# Patient Record
Sex: Male | Born: 1960 | Race: White | Hispanic: No | Marital: Married | Smoking: Never smoker
Health system: Southern US, Community
[De-identification: ages and names within clinical notes are randomized; demographics above are authoritative.]

---

## 2017-04-03 ENCOUNTER — Encounter (HOSPITAL_COMMUNITY): Payer: Self-pay | Admitting: Emergency Medicine

## 2017-04-03 ENCOUNTER — Emergency Department (HOSPITAL_COMMUNITY): Payer: Managed Care, Other (non HMO)

## 2017-04-03 ENCOUNTER — Emergency Department (HOSPITAL_COMMUNITY)
Admission: EM | Admit: 2017-04-03 | Discharge: 2017-04-04 | Disposition: A | Discharge status: Other Acute Inpatient Hospital | Payer: Managed Care, Other (non HMO) | Attending: Emergency Medicine | Admitting: Emergency Medicine

## 2017-04-03 DIAGNOSIS — S01111A Laceration without foreign body of right eyelid and periocular area, initial encounter: Secondary | ICD-10-CM | POA: Insufficient documentation

## 2017-04-03 DIAGNOSIS — S51851A Open bite of right forearm, initial encounter: Secondary | ICD-10-CM | POA: Insufficient documentation

## 2017-04-03 DIAGNOSIS — Y999 Unspecified external cause status: Secondary | ICD-10-CM | POA: Diagnosis not present

## 2017-04-03 DIAGNOSIS — S61552A Open bite of left wrist, initial encounter: Secondary | ICD-10-CM | POA: Diagnosis not present

## 2017-04-03 DIAGNOSIS — S0185XA Open bite of other part of head, initial encounter: Secondary | ICD-10-CM | POA: Diagnosis not present

## 2017-04-03 DIAGNOSIS — Y9389 Activity, other specified: Secondary | ICD-10-CM | POA: Insufficient documentation

## 2017-04-03 DIAGNOSIS — R51 Headache: Secondary | ICD-10-CM | POA: Diagnosis not present

## 2017-04-03 DIAGNOSIS — Y929 Unspecified place or not applicable: Secondary | ICD-10-CM | POA: Diagnosis not present

## 2017-04-03 DIAGNOSIS — Z23 Encounter for immunization: Secondary | ICD-10-CM | POA: Diagnosis not present

## 2017-04-03 DIAGNOSIS — W540XXA Bitten by dog, initial encounter: Secondary | ICD-10-CM | POA: Insufficient documentation

## 2017-04-03 DIAGNOSIS — H209 Unspecified iridocyclitis: Secondary | ICD-10-CM

## 2017-04-03 MED ORDER — TETANUS-DIPHTH-ACELL PERTUSSIS 5-2.5-18.5 LF-MCG/0.5 IM SUSP
0.5000 mL | Freq: Once | INTRAMUSCULAR | Status: AC
  Administered 2017-04-03: 0.5 mL via INTRAMUSCULAR

## 2017-04-03 MED ORDER — TETRACAINE HCL 0.5 % OP SOLN
1.0000 [drp] | Freq: Once | OPHTHALMIC | Status: AC
  Administered 2017-04-03: 1 [drp] via OPHTHALMIC
  Filled 2017-04-03: qty 4

## 2017-04-03 MED ORDER — BUPIVACAINE HCL (PF) 0.5 % IJ SOLN
30.0000 mL | Freq: Once | INTRAMUSCULAR | Status: AC
  Administered 2017-04-03: 30 mL
  Filled 2017-04-03: qty 30

## 2017-04-03 MED ORDER — ONDANSETRON HCL 4 MG/2ML IJ SOLN
INTRAMUSCULAR | Status: AC
  Filled 2017-04-03: qty 2

## 2017-04-03 MED ORDER — HYDROMORPHONE HCL 1 MG/ML IJ SOLN
INTRAMUSCULAR | Status: AC
  Filled 2017-04-03: qty 1

## 2017-04-03 MED ORDER — HYDROMORPHONE HCL 1 MG/ML IJ SOLN
1.0000 mg | Freq: Once | INTRAMUSCULAR | Status: AC
  Administered 2017-04-03: 1 mg via INTRAVENOUS

## 2017-04-03 MED ORDER — LIDOCAINE HCL (PF) 1 % IJ SOLN
10.0000 mL | Freq: Once | INTRAMUSCULAR | Status: AC
  Administered 2017-04-04: 10 mL via INTRADERMAL
  Filled 2017-04-03: qty 10

## 2017-04-03 MED ORDER — FLUORESCEIN SODIUM 0.6 MG OP STRP
1.0000 | ORAL_STRIP | Freq: Once | OPHTHALMIC | Status: AC
  Administered 2017-04-03: 1 via OPHTHALMIC
  Filled 2017-04-03: qty 1

## 2017-04-03 MED ORDER — ONDANSETRON HCL 4 MG/2ML IJ SOLN
4.0000 mg | Freq: Once | INTRAMUSCULAR | Status: AC
  Administered 2017-04-03: 4 mg via INTRAVENOUS

## 2017-04-03 MED ORDER — SODIUM CHLORIDE 0.9 % IV SOLN
3.0000 g | Freq: Once | INTRAVENOUS | Status: AC
  Administered 2017-04-03: 3 g via INTRAVENOUS
  Filled 2017-04-03: qty 3

## 2017-04-03 NOTE — ED Triage Notes (Signed)
Pt attacked by his pitbull. Lac to right head- skull showing. Right eyebrow lac. Bilateral Eyes intact. Deep right forearm lac with muscle, fascia showing, deep right lateral thigh lac. Left arm puncture wounds and and wrist laceration. Bleeding controlled. EMS gave Fentanyl. Pt alert and oriented. BP 118/70.

## 2017-04-03 NOTE — ED Notes (Signed)
Assumed care on pt. , pt. currently at CT scan.  

## 2017-04-03 NOTE — Consult Note (Signed)
Ophthalmology Consult  This is a 56 yo male who has an ocular history of cataract surgery as well as Lasik surgery who was attacked by his pitbull today.  Pt with scalp lacerations as well as bites to the leg.  Pt also had bite to the right side of face and eye.  Pt feels vision is good but is seeing new floaters in the right eye.  On exam pt was 20/40 in the right eye and 20/30 in the left eye.  After seeing no obvious open globe IOP was checked and found to be 16 in the right and 14 in the left.  The right pupil was slightly larger than the left but no obvious RAPD was seen.  Extraocular motility was intact and visual field was full to confrontation.  On exam pt was found to have a transverse laceration of the right upper eyelid which appears to involve the levator being that there is very little levator function.  Pt has two verticle full thickness lacerations that are through the lid margin.  Pt with inferior lid laceration at lid margin as well.  Pt has subconjunctival heme inferiorly and possible puncture wound superotemporally however seidel negative.  The cornea is clear in both eyes.  Pt does have 2-3+ cell/flare in the right eye.  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  On dfe of the right eye pt was found to have commotio and intraretinal heme around optic nerve and throughout retina.  A/P 1.Trauma to the right eye with multiple lid lacerations.  Can not rule out open globe however vision stable and iop normal.  Due to extensive lid lacerations and involvement of levator muscle [facility].  This is a dirty wound being from dig bite and pt at high risk for infection.    Thank you for allowing me to participate in the care of this patient.  Please feel free to contact me if you have any concerns.  Mia Creekimothy Camilia Caywood, M.D. Cell [phone] Office [phone]

## 2017-04-03 NOTE — ED Provider Notes (Signed)
[facility]-EMERGENCY DEPT Provider Note   CSN: [id number] Arrival date & time: 04/03/17  1825     History   Chief Complaint Chief Complaint  Patient presents with  . Animal Bite    HPI Eric Salinas is a 56 y.o. male.  The history is provided by the patient and medical records.  Animal Bite  Contact animal:  Dog Time since incident:  1 hour Pain details:    Quality:  Aching   Severity:  Severe   Timing:  Constant   Progression:  Unchanged Incident location:  Home Provoked: provoked   Animal's rabies vaccination status:  Up to date Animal in possession: yes   Tetanus status:  Unknown Relieved by:  Nothing Worsened by:  Nothing Ineffective treatments:  None tried Associated symptoms: no fever and no rash     History reviewed. No pertinent past medical history.  There are no active problems to display for this patient.   History reviewed. No pertinent surgical history.     Home Medications    Prior to Admission medications   Not on File    Family History History reviewed. No pertinent family history.  Social History Social History  Substance Use Topics  . Smoking status: Never Smoker  . Smokeless tobacco: Never Used  . Alcohol use Yes     Allergies   Patient has no known allergies.   Review of Systems Review of Systems  Constitutional: Negative for chills and fever.  HENT: Negative for ear pain and sore throat.   Eyes: Negative for pain and visual disturbance.  Respiratory: Negative for cough and shortness of breath.   Cardiovascular: Negative for chest pain and palpitations.  Gastrointestinal: Negative for abdominal pain and vomiting.  Genitourinary: Negative for dysuria and hematuria.  Musculoskeletal: Negative for arthralgias and back pain.  Skin: Positive for wound. Negative for color change and rash.  Neurological: Negative for seizures and syncope.  All other systems reviewed and are negative.    Physical Exam Updated Vital  Signs BP 132/78 (BP Location: Left Arm)   Pulse 92   Resp 16   Ht 6' (1.829 m)   Wt 83.9 kg (185 lb)   SpO2 99%   BMI 25.09 kg/m   Physical Exam  Constitutional: He appears well-developed.  HENT:  Head: Normocephalic and atraumatic.  Complex R upper eyelid laceration involving deep tissues. Inferior portion of lacerated upper eyelid does not move with rest of eyelid. R Scalp laceration with exposed subdermal tissue. R eye with NO extravasation after fluorescein stain. R eye appears red and inflamed  Eyes: Conjunctivae are normal.  Neck: Neck supple.  Cardiovascular: Normal rate and regular rhythm.   No murmur heard. Pulmonary/Chest: Effort normal and breath sounds normal. No respiratory distress.  Abdominal: Soft. There is no tenderness.  Musculoskeletal: He exhibits tenderness.  L wrist linear laceration repaired. R forearm with complex laceration with damaged muscle. R knee laceration with exposed tissue. Per [facility], wounds wrapped with wet-to-dry dressing for followup.   Neurological: He is alert. No cranial nerve deficit. Coordination normal.  Moves all extremities  Skin: Skin is warm and dry.  Nursing note and vitals reviewed.    ED Treatments / Results  Labs (all labs ordered are listed, but only abnormal results are displayed) Labs Reviewed - No data to display  EKG  EKG Interpretation None       Radiology Dg Forearm Right  Result Date: 04/03/2017 CLINICAL DATA:  Pt c/o generalized left hand pain, proximal and  posterior right arm pain severe laceration/dog bite, and lateral right knee severe laceration/dog bite after his dog attacked him today. EXAM: RIGHT FOREARM - 2 VIEW COMPARISON:  None. FINDINGS: Significant soft tissue lacerations are identified along the mid aspect of the forearm. No forearm fracture. At the posterior base the wrist, a small bone density is identified, raising the question of a triquetrum fracture. Correlation with point tenderness is  recommended. Consider wrist views if needed. IMPRESSION: Significant soft tissue lacerations of the forearm. Question of injury of the wrist. Consider views of the wrist as needed. Electronically Signed   By: Norva Pavlov M.D.   On: 04/03/2017 20:02   Ct Head Wo Contrast  Result Date: 04/03/2017 CLINICAL DATA:  56 year old male attack by dog with a laceration to the right side of the head. EXAM: CT HEAD WITHOUT CONTRAST CT MAXILLOFACIAL WITHOUT CONTRAST TECHNIQUE: Multidetector CT imaging of the head and maxillofacial structures were performed using the standard protocol without intravenous contrast. Multiplanar CT image reconstructions of the maxillofacial structures were also generated. COMPARISON:  None. FINDINGS: CT HEAD FINDINGS Brain: No evidence of acute infarction, hemorrhage, hydrocephalus, extra-axial collection or mass lesion/mass effect. Vascular: No hyperdense vessel or unexpected calcification. Skull: Normal. Negative for fracture or focal lesion. Other: There is laceration of the skin over the right temple with extension of soft tissue air anteriorly and to the right periorbital region. No large hematoma or drainable fluid collection. CT MAXILLOFACIAL FINDINGS Osseous: No fracture or mandibular dislocation. No destructive process. Orbits: Negative. No traumatic or inflammatory finding. Sinuses: Clear. Soft tissues: There is laceration of the skin over the right temporal area. Moderate amount of soft tissue air in the right temporal region and masticator space as well as right periorbital area. No fluid collection or hematoma. IMPRESSION: 1. No acute intracranial pathology. 2. No acute facial bone fractures. 3. Laceration of the skin over the right temple with extension of soft tissue air to the right periorbital region. No large hematoma or fluid collection. Electronically Signed   By: Elgie Collard M.D.   On: 04/03/2017 19:25   Dg Knee Complete 4 Views Right  Result Date:  04/03/2017 CLINICAL DATA:  Pt c/o generalized left hand pain, proximal and posterior right arm pain severe laceration/dog bite, and lateral right knee severe laceration/dog bite after his dog attacked him today. EXAM: RIGHT KNEE - COMPLETE 4+ VIEW COMPARISON:  None. FINDINGS: Soft tissue gas is identified along the anterior aspect of the distal thigh. No acute fracture or subluxation identified. No joint effusion. IMPRESSION: Soft tissue gas in the distal thigh.  No acute fracture of the knee. Electronically Signed   By: Norva Pavlov M.D.   On: 04/03/2017 20:07   Dg Hand Complete Left  Result Date: 04/03/2017 CLINICAL DATA:  Pt c/o generalized left hand pain, proximal and posterior right arm pain severe laceration/dog bite, and lateral right knee severe laceration/dog bite after his dog attacked him today. EXAM: LEFT HAND - COMPLETE 3+ VIEW COMPARISON:  None. FINDINGS: Soft tissue gas is noted along the distal aspect of the forearm. There is no acute fracture or subluxation. No radiopaque foreign body. IMPRESSION: Soft tissue gas along the distal forearm. Electronically Signed   By: Norva Pavlov M.D.   On: 04/03/2017 20:00   Ct Maxillofacial Wo Contrast  Result Date: 04/03/2017 CLINICAL DATA:  56 year old male attack by dog with a laceration to the right side of the head. EXAM: CT HEAD WITHOUT CONTRAST CT MAXILLOFACIAL WITHOUT CONTRAST TECHNIQUE: Multidetector CT  imaging of the head and maxillofacial structures were performed using the standard protocol without intravenous contrast. Multiplanar CT image reconstructions of the maxillofacial structures were also generated. COMPARISON:  None. FINDINGS: CT HEAD FINDINGS Brain: No evidence of acute infarction, hemorrhage, hydrocephalus, extra-axial collection or mass lesion/mass effect. Vascular: No hyperdense vessel or unexpected calcification. Skull: Normal. Negative for fracture or focal lesion. Other: There is laceration of the skin over the right  temple with extension of soft tissue air anteriorly and to the right periorbital region. No large hematoma or drainable fluid collection. CT MAXILLOFACIAL FINDINGS Osseous: No fracture or mandibular dislocation. No destructive process. Orbits: Negative. No traumatic or inflammatory finding. Sinuses: Clear. Soft tissues: There is laceration of the skin over the right temporal area. Moderate amount of soft tissue air in the right temporal region and masticator space as well as right periorbital area. No fluid collection or hematoma. IMPRESSION: 1. No acute intracranial pathology. 2. No acute facial bone fractures. 3. Laceration of the skin over the right temple with extension of soft tissue air to the right periorbital region. No large hematoma or fluid collection. Electronically Signed   By: Elgie Collard M.D.   On: 04/03/2017 19:25    Procedures .Marland KitchenLaceration Repair Date/Time: 04/03/2017 6:59 PM Performed by: Hebert Soho Authorized by: Hebert Soho   Consent:    Consent obtained:  Verbal   Consent given by:  Patient   Risks discussed:  Pain, infection, poor cosmetic result and poor wound healing   Alternatives discussed:  No treatment Anesthesia (see MAR for exact dosages):    Anesthesia method:  Local infiltration   Local anesthetic:  Bupivacaine 0.5% w/o epi Laceration details:    Location:  Shoulder/arm   Shoulder/arm location:  L lower arm   Length (cm):  4 Repair type:    Repair type:  Simple Pre-procedure details:    Preparation:  Patient was prepped and draped in usual sterile fashion Treatment:    Amount of cleaning:  Extensive   Irrigation solution:  Sterile water Skin repair:    Repair method:  Sutures   Number of sutures:  5 Approximation:    Approximation:  Close   Vermilion border: well-aligned   Post-procedure details:    Patient tolerance of procedure:  Tolerated well, no immediate complications   .Marland KitchenLaceration Repair Date/Time: 04/04/2017 12:32 AM Performed by: Hebert Soho Authorized by: Hebert Soho   Consent:    Consent obtained:  Verbal   Consent given by:  Patient   Risks discussed:  Infection, pain, poor cosmetic result, poor wound healing and need for additional repair Anesthesia (see MAR for exact dosages):    Anesthesia method:  Local infiltration   Local anesthetic:  Lidocaine 1% w/o epi Laceration details:    Location:  Scalp   Scalp location:  R temporal Repair type:    Repair type:  Simple Pre-procedure details:    Preparation:  Patient was prepped and draped in usual sterile fashion Treatment:    Area cleansed with:  Soap and water   Amount of cleaning:  Standard   Visualized foreign bodies/material removed: no   Mucous membrane repair:    Suture size:  4-0   Suture material:  Vicryl Skin repair:    Repair method:  Staples   Number of staples:  11 Approximation:    Approximation:  Loose   Vermilion border: well-aligned   Post-procedure details:    Patient tolerance of procedure:  Tolerated well, no immediate complications   (including critical  care time)  Medications Ordered in ED Medications  Tdap (BOOSTRIX) injection 0.5 mL (0.5 mLs Intramuscular Given 04/03/17 1839)  Ampicillin-Sulbactam (UNASYN) 3 g in sodium chloride 0.9 % 100 mL IVPB (0 g Intravenous Stopped 04/03/17 2010)  HYDROmorphone (DILAUDID) injection 1 mg (1 mg Intravenous Given 04/03/17 1839)  ondansetron (ZOFRAN) injection 4 mg (4 mg Intravenous Given 04/03/17 1839)  tetracaine (PONTOCAINE) 0.5 % ophthalmic solution 1 drop (1 drop Both Eyes Given by Other 04/03/17 1849)  fluorescein ophthalmic strip 1 strip (1 strip Both Eyes Given by Other 04/03/17 1849)  bupivacaine (MARCAINE) 0.5 % injection 30 mL (30 mLs Infiltration Given 04/03/17 1953)     Initial Impression / Assessment and Plan / ED Course  I have reviewed the triage vital signs and the nursing notes.  Pertinent labs & imaging results that were available during my care of the patient were reviewed by me  and considered in my medical decision making (see chart for details).     56 year old previously male who presents via EMS for multiple complex dog bites. Occurred around 1700 today. Bitten by rescued pitbull as patient was attemptin to exchange object in dog's mouth. Pt was bitten across R scalp, R eye, R forearm, L wrist/hand, and R distal thigh. Pt does not take anticoagulation. No LOC. Unsure of last tetanus.  AF, VSS. Complex R upper eyelid laceration involving deep tissues. Inferior portion of lacerated upper eyelid does not move with rest of eyelid. R Scalp laceration with exposed subdermal tissue. R eye with NO extravasation after fluorescein stain. R eye appears red and inflamed. L wrist linear laceration repaired. R forearm with complex laceration with damaged muscle. R knee laceration with exposed tissue.   Per [facility], R forearm and R knee wounds wrapped with wet-to-dry dressing for followup.   Tetanus and unasyn given. L wrist and hand lacerations repaired. R scalp laceration repaired.  Ophthalmology consulted for complex R upper eyelid laceration involving underlying muscle. Traumatic iritis noted with cell and flare. IOP normal per Ophthalmology. Due to complexity of eyelid wound, [facility] (accepted by Ophthalmology).   Pt care d/w Dr. Silverio LayYao  Final Clinical Impressions(s) / ED Diagnoses   Final diagnoses:  Dog bite of face, initial encounter  Dog bite of right forearm, initial encounter  Dog bite of left wrist, initial encounter  Traumatic iritis  Right eyelid laceration, initial encounter    New Prescriptions New Prescriptions   No medications on file     Hebert SohoMu, Kareem Cathey, MD 04/04/17 0035    Charlynne PanderYao, David Hsienta, MD 04/04/17 [phone]

## 2017-04-04 DIAGNOSIS — Y929 Unspecified place or not applicable: Secondary | ICD-10-CM | POA: Diagnosis not present

## 2017-04-04 DIAGNOSIS — W540XXA Bitten by dog, initial encounter: Secondary | ICD-10-CM | POA: Diagnosis not present

## 2017-04-04 DIAGNOSIS — S01111A Laceration without foreign body of right eyelid and periocular area, initial encounter: Secondary | ICD-10-CM | POA: Diagnosis present

## 2017-04-04 DIAGNOSIS — S51851A Open bite of right forearm, initial encounter: Secondary | ICD-10-CM | POA: Diagnosis not present

## 2017-04-04 DIAGNOSIS — R51 Headache: Secondary | ICD-10-CM | POA: Diagnosis not present

## 2017-04-04 DIAGNOSIS — Y9389 Activity, other specified: Secondary | ICD-10-CM | POA: Diagnosis not present

## 2017-04-04 DIAGNOSIS — S61552A Open bite of left wrist, initial encounter: Secondary | ICD-10-CM | POA: Diagnosis not present

## 2017-04-04 DIAGNOSIS — Y999 Unspecified external cause status: Secondary | ICD-10-CM | POA: Diagnosis not present

## 2017-04-04 DIAGNOSIS — S0185XA Open bite of other part of head, initial encounter: Secondary | ICD-10-CM | POA: Diagnosis not present

## 2017-04-04 DIAGNOSIS — Z23 Encounter for immunization: Secondary | ICD-10-CM | POA: Diagnosis not present

## 2017-04-04 MED ORDER — HYDROMORPHONE HCL 1 MG/ML IJ SOLN
1.0000 mg | Freq: Once | INTRAMUSCULAR | Status: AC
  Administered 2017-04-04: 1 mg via INTRAVENOUS
  Filled 2017-04-04: qty 1

## 2018-12-26 IMAGING — CT CT HEAD W/O CM
3 of 6 series · 15 of 47 positions shown, 18 images · non-contrast
Comparison: None.

CLINICAL DATA: 56-year-old male attack by dog with a laceration to
the right side of the head.

EXAM:
CT HEAD WITHOUT CONTRAST
CT MAXILLOFACIAL WITHOUT CONTRAST
TECHNIQUE: Multidetector CT imaging of the head and maxillofacial structures
were performed using the standard protocol without intravenous
contrast. Multiplanar CT image reconstructions of the maxillofacial
structures were also generated.

[Series 5: head 3.0 mpr cor · coronal · 0.32mm/px · 3 of 73 slices shown]
[im 18/73  brain]
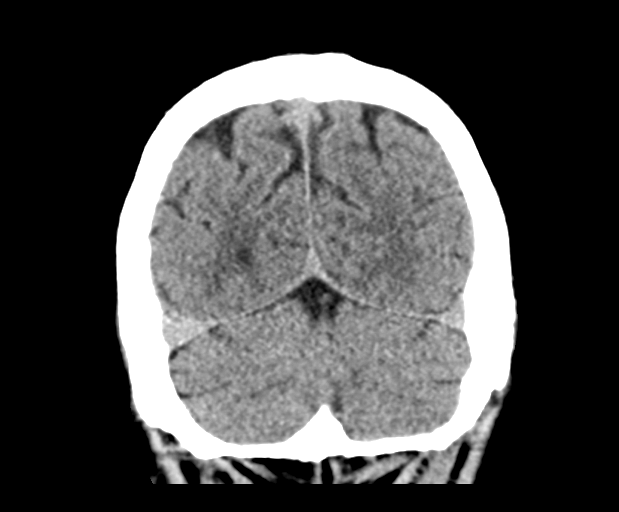
[im 36/73  brain]
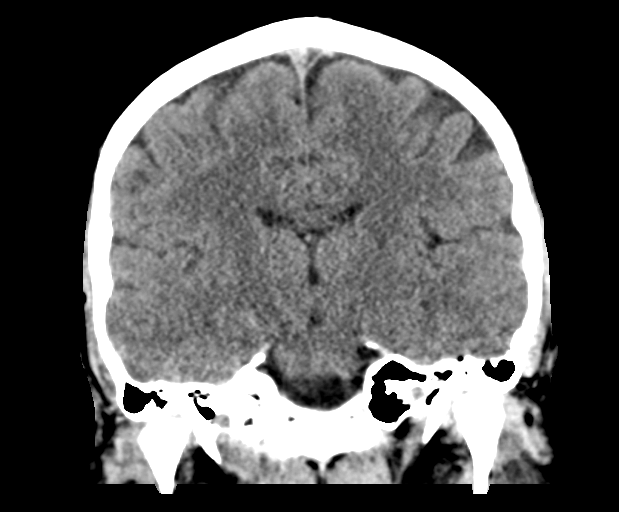
[im 54/73  brain]
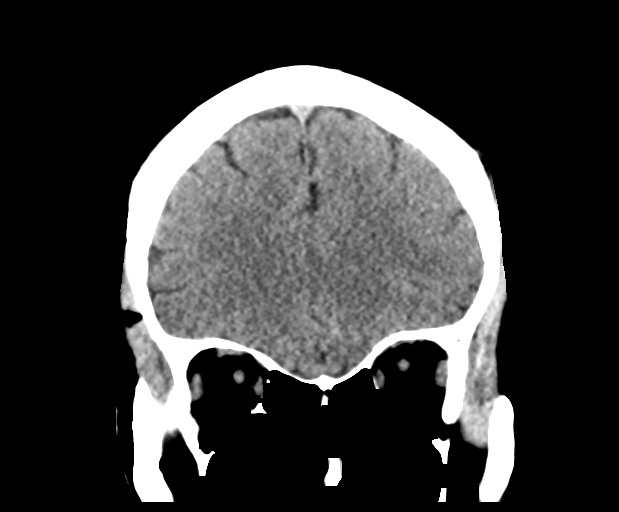

[Series 7: facial/ orbits 2.0 h30s · axial · 0.34mm/px · z∈[-248,-90]mm · 10 of 93 slices shown, 13 images]
[im 9/93  brain]
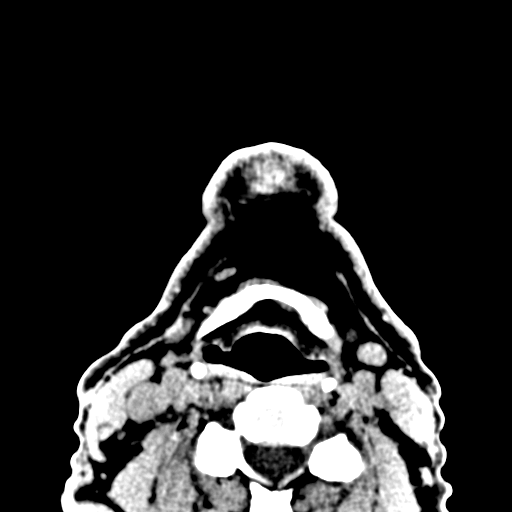
[im 9/93  bone]
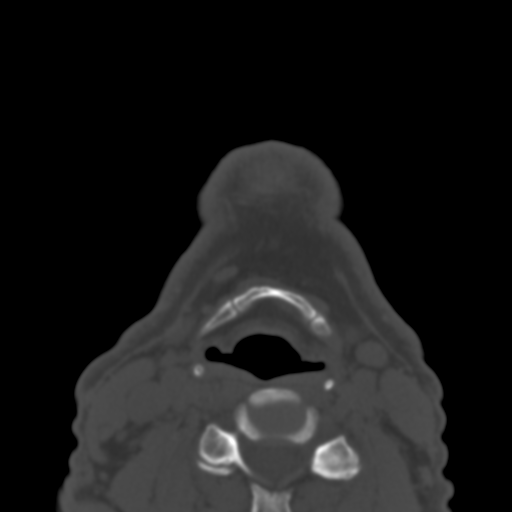
[im 18/93  brain]
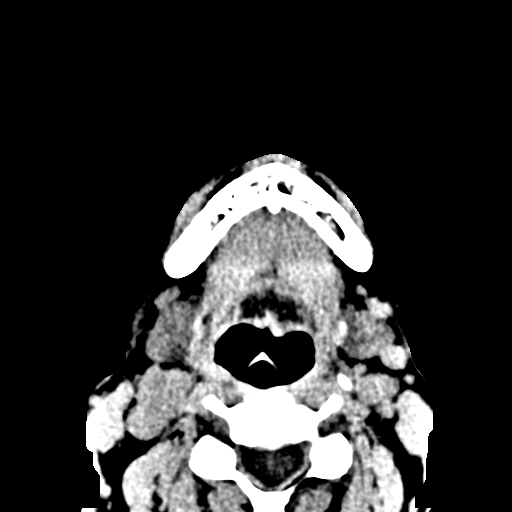
[im 27/93  brain]
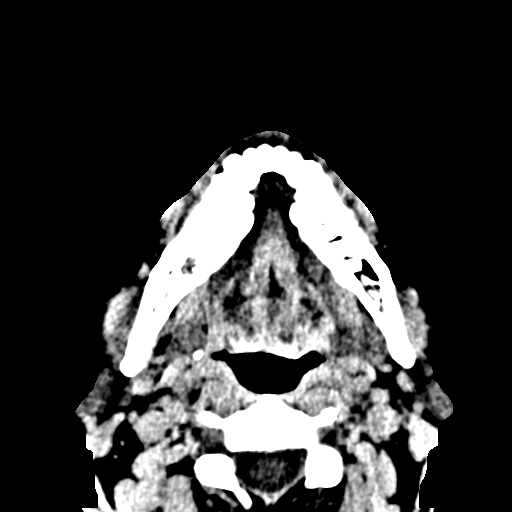
[im 36/93  brain]
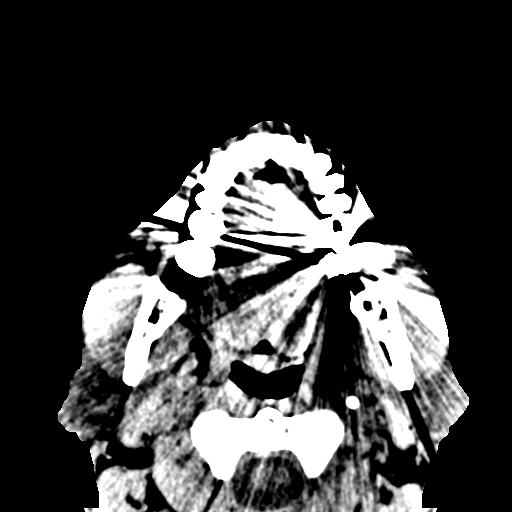
[im 44/93  brain]
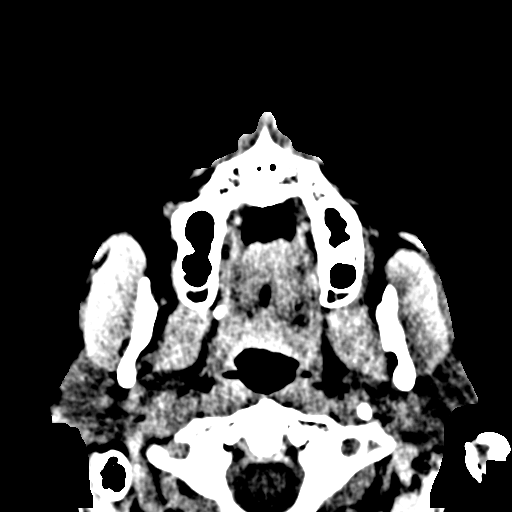
[im 44/93  bone]
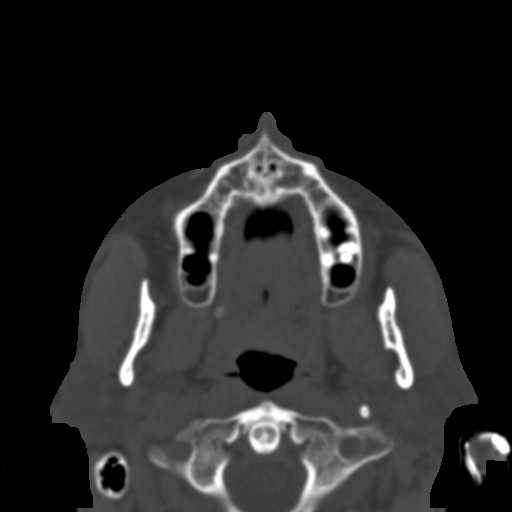
[im 53/93  brain]
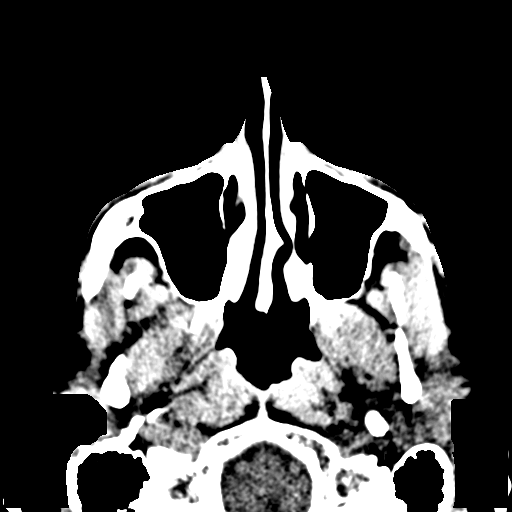
[im 62/93  brain]
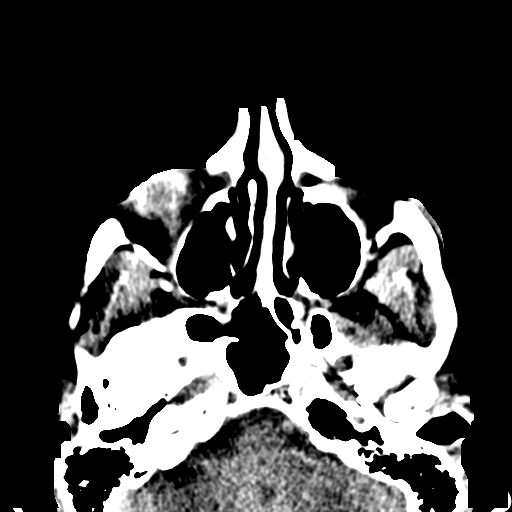
[im 71/93  brain]
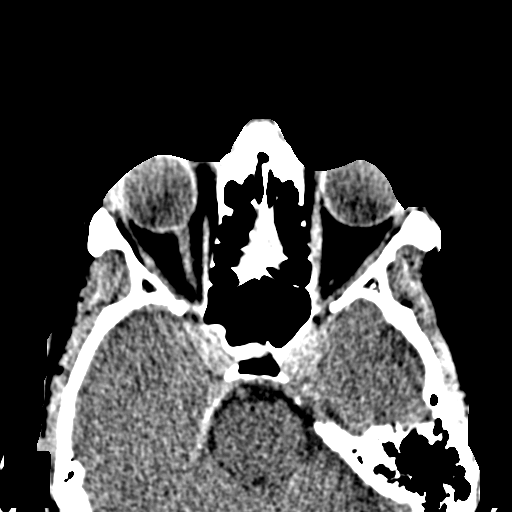
[im 79/93  brain]
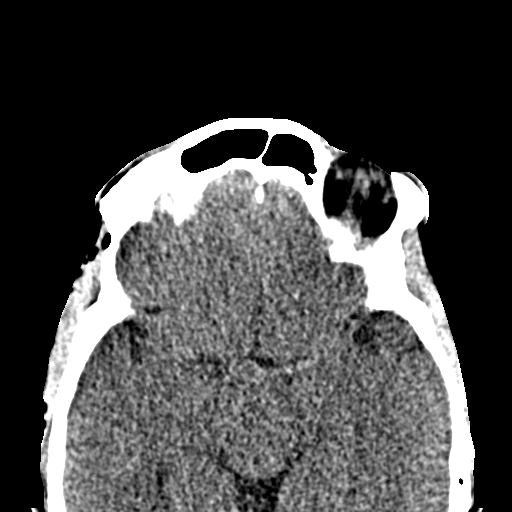
[im 79/93  bone]
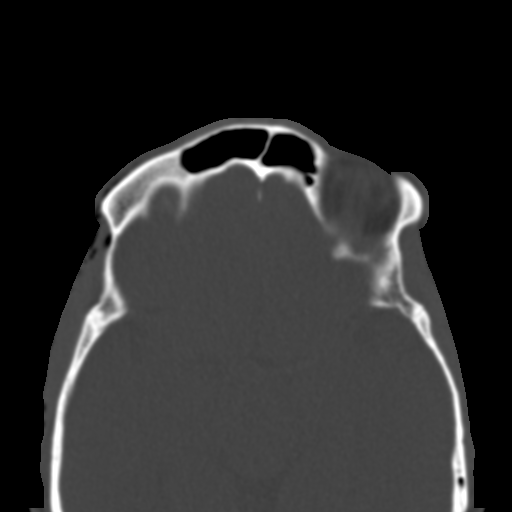
[im 88/93  brain]
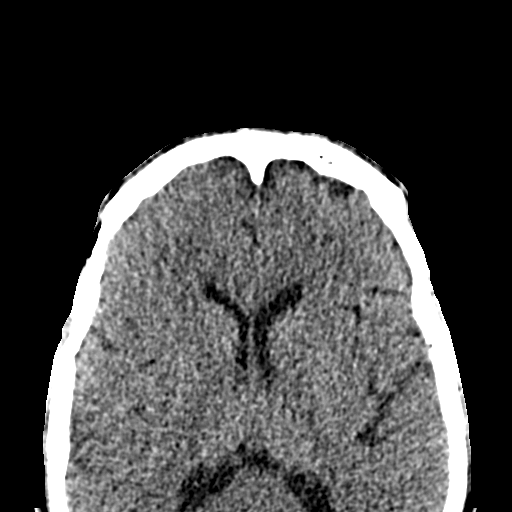

[Series 12: sagittal soft tissue · sagittal · 0.36mm/px · 2 of 97 slices shown]
[im 33/97  brain]
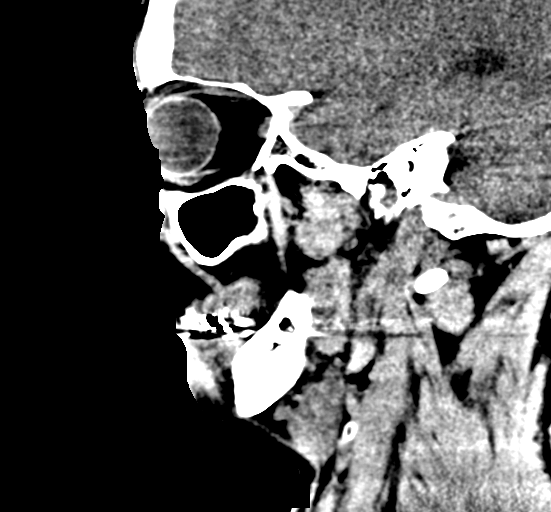
[im 65/97  brain]
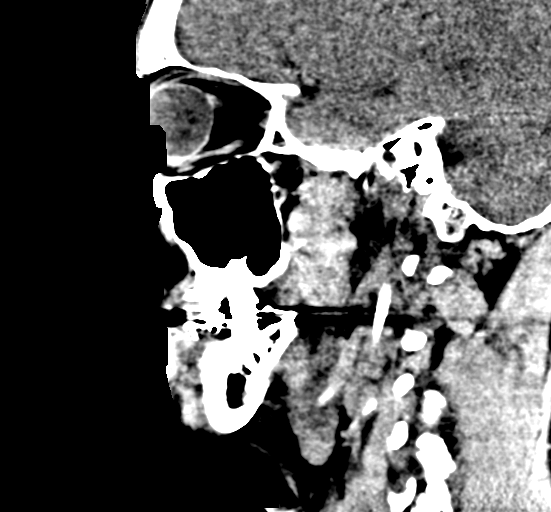

[15 of 47 positions shown; findings below may reference images not displayed]

FINDINGS: CT HEAD FINDINGS

Brain: No evidence of acute infarction, hemorrhage, hydrocephalus,
extra-axial collection or mass lesion/mass effect.

Vascular: No hyperdense vessel or unexpected calcification.

Skull: Normal. Negative for fracture or focal lesion.

Other: There is laceration of the skin over the right temple with
extension of soft tissue air anteriorly and to the right periorbital
region. No large hematoma or drainable fluid collection.

CT MAXILLOFACIAL FINDINGS

Osseous: No fracture or mandibular dislocation. No destructive
process.

Orbits: Negative. No traumatic or inflammatory finding.

Sinuses: Clear.

Soft tissues: There is laceration of the skin over the right
temporal area. Moderate amount of soft tissue air in the right
temporal region and masticator space as well as right periorbital
area. No fluid collection or hematoma.
IMPRESSION: 1. No acute intracranial pathology.
2. No acute facial bone fractures.
3. Laceration of the skin over the right temple with extension of
soft tissue air to the right periorbital region. No large hematoma
or fluid collection.

## 2018-12-26 IMAGING — DX DG FOREARM 2V*R*
2 series · 2 of 2 positions shown · non-contrast
Comparison: None.

CLINICAL DATA: Pt c/o generalized left hand pain, proximal and
posterior right arm pain severe laceration/dog bite, and lateral
right knee severe laceration/dog bite after his dog attacked him
today.

EXAM:
RIGHT FOREARM - 2 VIEW

[forearm ap]
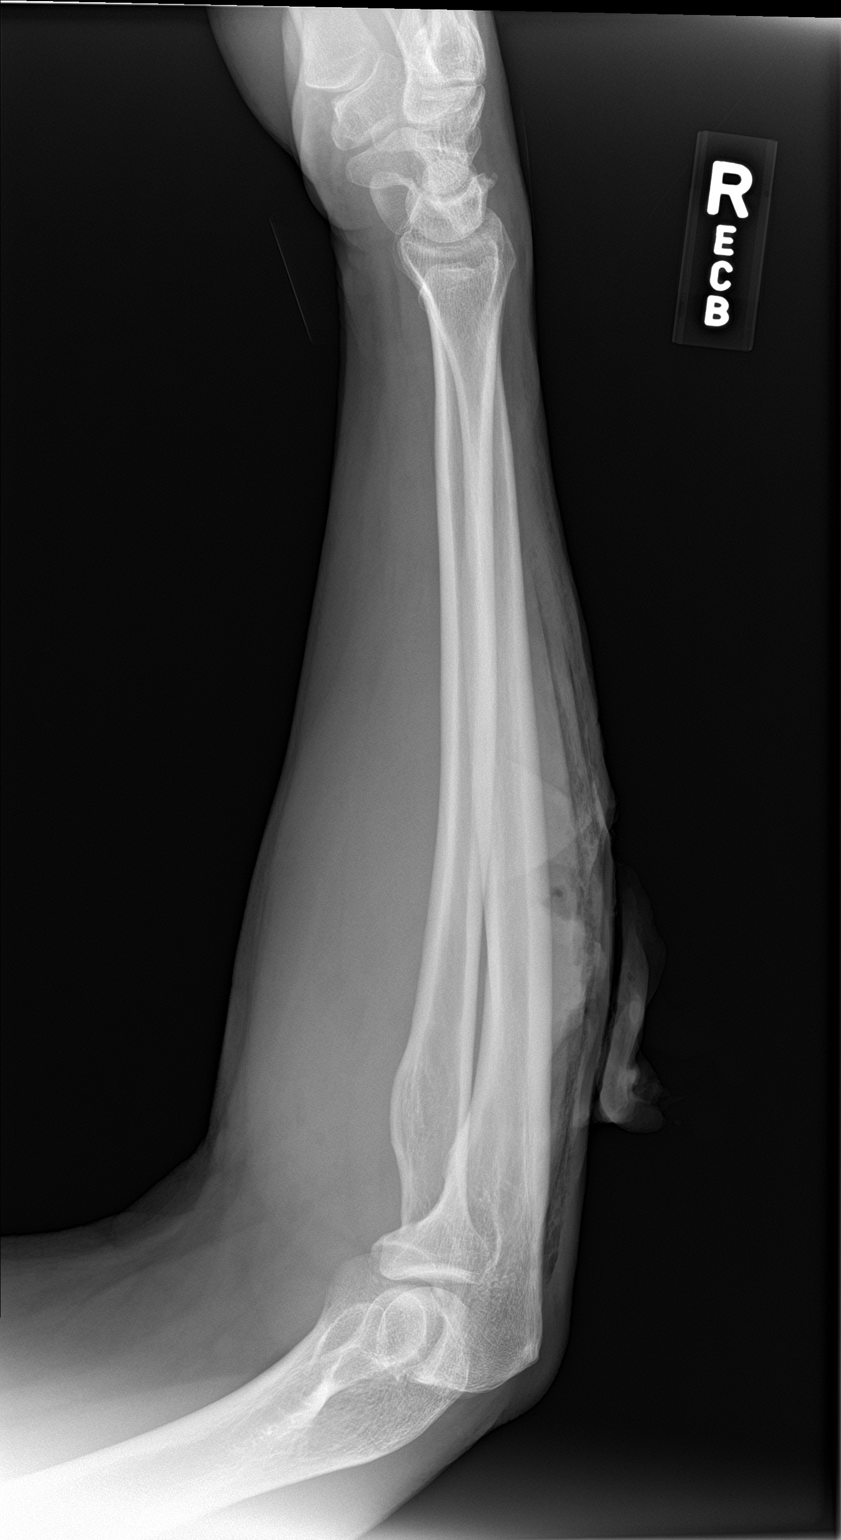

[forearm lat]
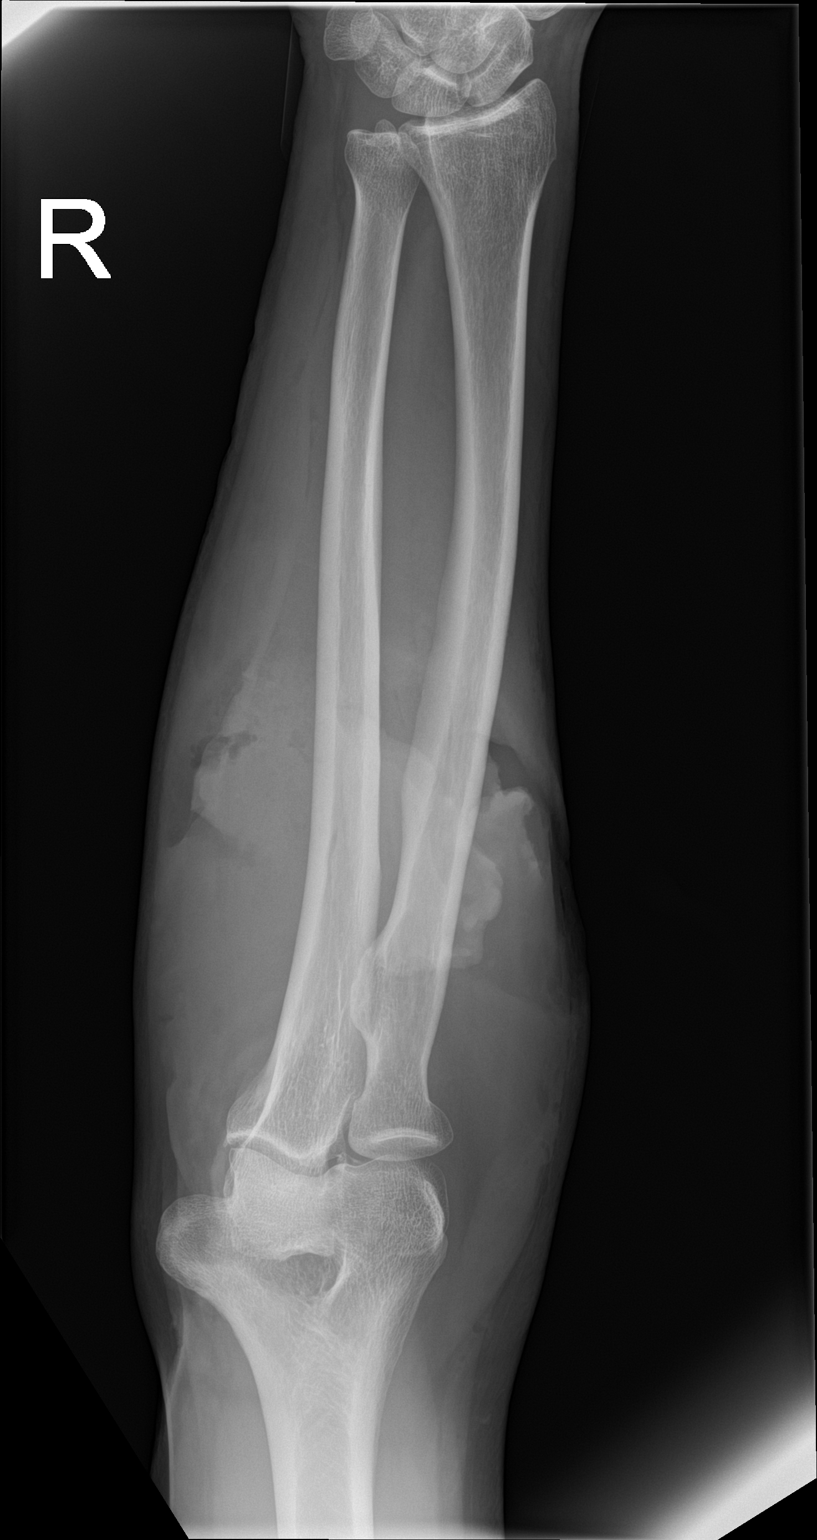

[2 of 2 positions shown; findings below may reference images not displayed]

FINDINGS: Significant soft tissue lacerations are identified along the mid
aspect of the forearm. No forearm fracture. At the posterior base
the wrist, a small bone density is identified, raising the question
of a triquetrum fracture. Correlation with point tenderness is
recommended. Consider wrist views if needed.
IMPRESSION: Significant soft tissue lacerations of the forearm. Question of
injury of the wrist. Consider views of the wrist as needed.
